# Patient Record
Sex: Female | Born: 1990 | Race: White | Hispanic: No | Marital: Single | Smoking: Never smoker
Health system: Southern US, Community
[De-identification: ages and names within clinical notes are randomized; demographics above are authoritative.]

---

## 2015-10-05 ENCOUNTER — Encounter (HOSPITAL_COMMUNITY): Payer: Self-pay

## 2015-10-05 ENCOUNTER — Emergency Department (HOSPITAL_COMMUNITY)
Admission: EM | Admit: 2015-10-05 | Discharge: 2015-10-05 | Disposition: A | Discharge status: Home / Self Care | Payer: Self-pay | Attending: Emergency Medicine | Admitting: Emergency Medicine

## 2015-10-05 ENCOUNTER — Emergency Department (HOSPITAL_COMMUNITY): Payer: Self-pay

## 2015-10-05 DIAGNOSIS — X509XXA Other and unspecified overexertion or strenuous movements or postures, initial encounter: Secondary | ICD-10-CM | POA: Insufficient documentation

## 2015-10-05 DIAGNOSIS — S29019A Strain of muscle and tendon of unspecified wall of thorax, initial encounter: Secondary | ICD-10-CM

## 2015-10-05 DIAGNOSIS — Y929 Unspecified place or not applicable: Secondary | ICD-10-CM | POA: Insufficient documentation

## 2015-10-05 DIAGNOSIS — S29012A Strain of muscle and tendon of back wall of thorax, initial encounter: Secondary | ICD-10-CM | POA: Insufficient documentation

## 2015-10-05 DIAGNOSIS — Y9341 Activity, dancing: Secondary | ICD-10-CM | POA: Insufficient documentation

## 2015-10-05 DIAGNOSIS — Y999 Unspecified external cause status: Secondary | ICD-10-CM | POA: Insufficient documentation

## 2015-10-05 LAB — CBC
HEMATOCRIT: 39.4 % (ref 36.0–46.0)
HEMOGLOBIN: 13.6 g/dL (ref 12.0–15.0)
MCH: 30.8 pg (ref 26.0–34.0)
MCHC: 34.5 g/dL (ref 30.0–36.0)
MCV: 89.3 fL (ref 78.0–100.0)
Platelets: 318 10*3/uL (ref 150–400)
RBC: 4.41 MIL/uL (ref 3.87–5.11)
RDW: 12.8 % (ref 11.5–15.5)
WBC: 10.7 10*3/uL — ABNORMAL HIGH (ref 4.0–10.5)

## 2015-10-05 LAB — BASIC METABOLIC PANEL
Anion gap: 10 (ref 5–15)
BUN: 6 mg/dL (ref 6–20)
CALCIUM: 9.6 mg/dL (ref 8.9–10.3)
CHLORIDE: 103 mmol/L (ref 101–111)
CO2: 26 mmol/L (ref 22–32)
CREATININE: 0.79 mg/dL (ref 0.44–1.00)
GFR calc Af Amer: >60 mL/min (ref >60)
GFR calc non Af Amer: >60 mL/min (ref >60)
GLUCOSE: 90 mg/dL (ref 65–99)
Potassium: 3.8 mmol/L (ref 3.5–5.1)
Sodium: 139 mmol/L (ref 135–145)

## 2015-10-05 LAB — URINALYSIS, ROUTINE W REFLEX MICROSCOPIC
BILIRUBIN URINE: NEGATIVE
GLUCOSE, UA: NEGATIVE mg/dL
HGB URINE DIPSTICK: NEGATIVE
Ketones, ur: NEGATIVE mg/dL
Leukocytes, UA: NEGATIVE
Nitrite: NEGATIVE
PROTEIN: NEGATIVE mg/dL
SPECIFIC GRAVITY, URINE: 1.005 (ref 1.005–1.030)
pH: 6.5 (ref 5.0–8.0)

## 2015-10-05 LAB — PREGNANCY, URINE: Preg Test, Ur: NEGATIVE

## 2015-10-05 MED ORDER — DIAZEPAM 5 MG PO TABS
5.0000 mg | ORAL_TABLET | Freq: Once | ORAL | Status: AC
  Administered 2015-10-05: 5 mg via ORAL
  Filled 2015-10-05: qty 1

## 2015-10-05 MED ORDER — TRAMADOL HCL 50 MG PO TABS: 50.0000 mg | ORAL_TABLET | Freq: Four times a day (QID) | ORAL | 0 refills | Status: AC | PRN

## 2015-10-05 MED ORDER — KETOROLAC TROMETHAMINE 15 MG/ML IJ SOLN
15.0000 mg | Freq: Once | INTRAMUSCULAR | Status: AC
  Administered 2015-10-05: 15 mg via INTRAVENOUS
  Filled 2015-10-05: qty 1

## 2015-10-05 MED ORDER — TRAMADOL HCL 50 MG PO TABS
50.0000 mg | ORAL_TABLET | Freq: Once | ORAL | Status: AC
  Administered 2015-10-05: 50 mg via ORAL
  Filled 2015-10-05: qty 1

## 2015-10-05 MED ORDER — DIAZEPAM 5 MG PO TABS: 5.0000 mg | ORAL_TABLET | Freq: Two times a day (BID) | ORAL | 0 refills | Status: AC

## 2015-10-05 NOTE — ED Notes (Signed)
No respiratory or acute distress noted alert and oriented x 3 call light in reach still having pain awaiting doctor to see pt.

## 2015-10-05 NOTE — ED Notes (Signed)
No respiratory or acute distress noted alert and oriented x 3 no reaction to medication noted call light in reach. 

## 2015-10-05 NOTE — ED Provider Notes (Signed)
[facility]-EMERGENCY DEPT Provider Note   CSN: [id number] Arrival date & time: 10/05/15  0351     History   Chief Complaint Chief Complaint  Patient presents with  . Flank Pain    HPI Victoria Cobb is a 25 y.o. female.  The history is provided by the patient.  Back Pain   This is a new problem. Episode onset: 10 days ago. The problem occurs constantly. The problem has been rapidly worsening. Associated with: Patient dances for a living and felt like she pulled something 10 days ago and then tonight while she was dancing suddenly had significant worsening of her pain. The pain is present in the thoracic spine. The quality of the pain is described as stabbing and shooting. The pain does not radiate. The pain is at a severity of 9/10. The pain is severe. The symptoms are aggravated by bending, twisting and certain positions (Deep breathing). The pain is the same all the time. Stiffness is present in the morning. Associated symptoms include chest pain. Pertinent negatives include no fever, no bowel incontinence, no dysuria, no pelvic pain and no weakness. She has tried NSAIDs and bed rest for the symptoms. The treatment provided mild relief. Risk factors: History of blood clots in her family and she does not take estrogens.    History reviewed. No pertinent past medical history.  There are no active problems to display for this patient.   History reviewed. No pertinent surgical history.  OB History    No data available       Home Medications    Prior to Admission medications   Medication Sig Start Date End Date Taking? Authorizing Provider  Multiple Vitamin (MULTIVITAMIN WITH MINERALS) TABS tablet Take 2 tablets by mouth daily.   Yes Historical Provider, MD    Family History History reviewed. No pertinent family history.  Social History Social History  Substance Use Topics  . Smoking status: Never Smoker  . Smokeless tobacco: Never Used  . Alcohol use No     Allergies     Ceclor [cefaclor] and Cefzil [cefprozil]   Review of Systems Review of Systems  Constitutional: Negative for fever.  Cardiovascular: Positive for chest pain.  Gastrointestinal: Negative for bowel incontinence.  Genitourinary: Negative for dysuria and pelvic pain.  Musculoskeletal: Positive for back pain.  Neurological: Negative for weakness.  All other systems reviewed and are negative.    Physical Exam Updated Vital Signs BP 108/78 (BP Location: Left Arm)   Pulse 84   Temp 98.5 F (36.9 C) (Oral)   Resp 20   Ht 5\' 2"  (1.575 m)   Wt 120 lb (54.4 kg)   LMP 09/04/2015   SpO2 98%   BMI 21.95 kg/m   Physical Exam  Constitutional: She is oriented to person, place, and time. She appears well-developed and well-nourished. No distress.  HENT:  Head: Normocephalic and atraumatic.  Mouth/Throat: Oropharynx is clear and moist.  Eyes: Conjunctivae and EOM are normal. Pupils are equal, round, and reactive to light.  Neck: Normal range of motion. Neck supple.  Cardiovascular: Normal rate, regular rhythm and intact distal pulses.   No murmur heard. Pulmonary/Chest: Effort normal and breath sounds normal. No respiratory distress. She has no wheezes. She has no rales.  Abdominal: Soft. She exhibits no distension. There is no tenderness. There is no rebound and no guarding.  Musculoskeletal: Normal range of motion. She exhibits tenderness. She exhibits no edema.       Thoracic back: She exhibits tenderness, pain and  spasm. She exhibits normal pulse.       Back:  Neurological: She is alert and oriented to person, place, and time.  Skin: Skin is warm and dry. No rash noted. No erythema.  Psychiatric: She has a normal mood and affect. Her behavior is normal.  Nursing note and vitals reviewed.    ED Treatments / Results  Labs (all labs ordered are listed, but only abnormal results are displayed) Labs Reviewed  CBC - Abnormal; Notable for the following:       Result Value   WBC  10.7 (*)    All other components within normal limits  URINALYSIS, ROUTINE W REFLEX MICROSCOPIC (NOT AT [facility])  PREGNANCY, URINE  BASIC METABOLIC PANEL    EKG  EKG Interpretation None       Radiology Dg Ribs Unilateral W/chest Right  Result Date: 10/05/2015 CLINICAL DATA:  Pulled RIGHT back muscle 1 week ago, fell pop tonight while dancing. Chest wall pain. EXAM: RIGHT RIBS AND CHEST - 3+ VIEW COMPARISON:  None. FINDINGS: No fracture or other bone lesions are seen involving the ribs. There is no evidence of pneumothorax or pleural effusion. Both lungs are clear. Heart size and mediastinal contours are within normal limits. IMPRESSION: Negative. Electronically Signed   By: Awilda Metroourtnay  Bloomer M.D.   On: 10/05/2015 06:30    Procedures Procedures (including critical care time)  Medications Ordered in ED Medications  ketorolac (TORADOL) 15 MG/ML injection 15 mg (not administered)     Initial Impression / Assessment and Plan / ED Course  I have reviewed the triage vital signs and the nursing notes.  Pertinent labs & imaging results that were available during my care of the patient were reviewed by me and considered in my medical decision making (see chart for details).  Clinical Course    Patient is a 25 year old healthy female presenting today with right-sided thoracic pain. This started approximately 10 days ago while she was pulled dancing. She has been using ibuprofen some relief and rested for the last 3 days however tonight she was on stage and didn't move and had sudden severe worsening of her pain that now is a 9 out of 10 and is uncomfortable with breathing and any movement. She denies shortness of breath, weakness or neurologic symptoms. As no symptoms concerning for a PE and is PERC negative. We will get a chest x-ray to rule out rib fracture or pneumothorax. She denies any other known trauma. She had labs and a urine pregnancy test done while in triage which were  unremarkable.  Patient given Toradol.  6:43 AM Minimal improved with toradol.  Imaging neg.  Will d/c home with treatment for muscle spasm  Final Clinical Impressions(s) / ED Diagnoses   Final diagnoses:  Acute thoracic myofascial strain, initial encounter    New Prescriptions New Prescriptions   DIAZEPAM (VALIUM) 5 MG TABLET    Take 1 tablet (5 mg total) by mouth 2 (two) times daily.   TRAMADOL (ULTRAM) 50 MG TABLET    Take 1 tablet (50 mg total) by mouth every 6 (six) hours as needed.     Gwyneth SproutWhitney Charmel Pronovost, MD 10/05/15 [id number]

## 2015-10-05 NOTE — ED Triage Notes (Signed)
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 She states that its hard to take a deep breath and move certain ways

## 2016-02-19 ENCOUNTER — Ambulatory Visit: Admit: 2016-02-19 | Payer: Self-pay | Admitting: Obstetrics & Gynecology

## 2016-02-19 SURGERY — HYMENECTOMY
Anesthesia: Choice

## 2016-06-20 ENCOUNTER — Encounter (HOSPITAL_COMMUNITY): Payer: Self-pay

## 2016-06-20 ENCOUNTER — Emergency Department (HOSPITAL_COMMUNITY)
Admission: EM | Admit: 2016-06-20 | Discharge: 2016-06-20 | Disposition: A | Discharge status: Home / Self Care | Payer: Self-pay | Attending: Emergency Medicine | Admitting: Emergency Medicine

## 2016-06-20 DIAGNOSIS — F10120 Alcohol abuse with intoxication, uncomplicated: Secondary | ICD-10-CM | POA: Insufficient documentation

## 2016-06-20 DIAGNOSIS — F10920 Alcohol use, unspecified with intoxication, uncomplicated: Secondary | ICD-10-CM

## 2016-06-20 NOTE — ED Notes (Signed)
Patient walked to bathroom without assistance and changed out of wet clothes. Patient walked back to bed without assistance.

## 2016-06-20 NOTE — ED Triage Notes (Signed)
Patient is highly intoxicated and uber driver brought patient to ED. Patient threw up in the uber.

## 2016-06-20 NOTE — ED Notes (Signed)
Pt was brought in by her [employer] driver, who statedshe was picked up at [facility] and was taking her home and she became unresponsive and on the way here the patient vomited in the [employer] car.

## 2016-06-20 NOTE — ED Provider Notes (Signed)
[facility] DEPT Provider Note   CSN: [id number] Arrival date & time: 06/20/16  0218    History   Chief Complaint Chief Complaint  Patient presents with  . intoxicated    LEVEL 5 CAVEAT SECONDARY TO INTOXICATION  HPI Victoria Cobb is a 26 y.o. female.  26 y/o female presents to the ED for acute ETOH intoxication. Per notes, patient transported by [facility] driver after she was picked up at the [facility] and became "unresponsive" in the back of the vehicle. Patient subsequently vomited in the back of the car. She is awake and alert to verbal and physical stimuli. Speech slurred. Denies nausea.      History reviewed. No pertinent past medical history.  There are no active problems to display for this patient.   History reviewed. No pertinent surgical history.  OB History    No data available       Home Medications    Prior to Admission medications   Medication Sig Start Date End Date Taking? Authorizing Provider  diazepam (VALIUM) 5 MG tablet Take 1 tablet (5 mg total) by mouth 2 (two) times daily. 10/05/15   Gwyneth Sprout, MD  Multiple Vitamin (MULTIVITAMIN WITH MINERALS) TABS tablet Take 2 tablets by mouth daily.    [provider]  traMADol (ULTRAM) 50 MG tablet Take 1 tablet (50 mg total) by mouth every 6 (six) hours as needed. 10/05/15   Gwyneth Sprout, MD    Family History History reviewed. No pertinent family history.  Social History Social History  Substance Use Topics  . Smoking status: Never Smoker  . Smokeless tobacco: Never Used  . Alcohol use Yes     Allergies   Ceclor [cefaclor] and Cefzil [cefprozil]   Review of Systems Review of Systems  Unable to perform ROS: Other  Acute intoxication; AMS   Physical Exam Updated Vital Signs BP 105/83   Pulse 87   Temp 98.1 F (36.7 C) (Oral)   Resp 15   Ht 5\' 4"  (1.626 m)   Wt 59 kg (130 lb)   SpO2 94%   BMI 22.31 kg/m   Physical Exam  Constitutional: She appears  well-developed and well-nourished. No distress.  Disheveled. Vomit in hair.  HENT:  Head: Normocephalic and atraumatic.  Eyes: Conjunctivae and EOM are normal. No scleral icterus.  Neck: Normal range of motion.  Cardiovascular: Normal rate, regular rhythm and intact distal pulses.   Pulmonary/Chest: Effort normal. No respiratory distress. She has no wheezes.  Respirations even and unlabored  Musculoskeletal: Normal range of motion.  Neurological: She is alert. She exhibits normal muscle tone. Coordination normal.  GCS 15. Agitated and cursing. Speech is goal oriented. Slurred. Patient moving all extremities.  Skin: Skin is warm and dry. No rash noted. She is not diaphoretic. No erythema. No pallor.  Psychiatric: She has a normal mood and affect. Her behavior is normal.  Nursing note and vitals reviewed.    ED Treatments / Results  Labs (all labs ordered are listed, but only abnormal results are displayed) Labs Reviewed - No data to display  EKG  EKG Interpretation None       Radiology No results found.  Procedures Procedures (including critical care time)  Medications Ordered in ED Medications - No data to display   Initial Impression / Assessment and Plan / ED Course  I have reviewed the triage vital signs and the nursing notes.  Pertinent labs & imaging results that were available during my care of the patient were  reviewed by me and considered in my medical decision making (see chart for details).     26 year old female presents for acute alcohol intoxication. She has been allowed to sober in the emergency department. She is not ambulatory without assistance. Speech is clear. Patient deemed clinically sober. Plan to discharge in the care of adult or friend; RN attempting to contact friend or family for pick-up.   Vitals:   06/20/16 0235 06/20/16 0422 06/20/16 0529 06/20/16 0604  BP:  98/65 111/74 105/83  Pulse:  79 79 87  Resp:  18 16 15   Temp:      TempSrc:       SpO2:  100% 97% 94%  Weight: 59 kg (130 lb)     Height: 5\' 4"  (1.626 m)       Final Clinical Impressions(s) / ED Diagnoses   Final diagnoses:  Alcoholic intoxication without complication [facility](HCC)    New Prescriptions New Prescriptions   No medications on file     Antony MaduraHumes, Vivianna Piccini, PA-C 06/20/16 0645    Molpus, Jonny RuizJohn, MD 06/20/16 [phone]

## 2017-11-03 ENCOUNTER — Other Ambulatory Visit: Payer: Self-pay | Admitting: Advanced Practice Midwife

## 2017-11-03 DIAGNOSIS — N6320 Unspecified lump in the left breast, unspecified quadrant: Secondary | ICD-10-CM

## 2017-11-21 ENCOUNTER — Other Ambulatory Visit (HOSPITAL_COMMUNITY): Payer: Self-pay | Admitting: *Deleted

## 2017-11-21 DIAGNOSIS — N6320 Unspecified lump in the left breast, unspecified quadrant: Secondary | ICD-10-CM

## 2017-12-09 ENCOUNTER — Ambulatory Visit (HOSPITAL_COMMUNITY)
Admission: RE | Admit: 2017-12-09 | Discharge: 2017-12-09 | Disposition: A | Discharge status: Home / Self Care | Payer: Self-pay | Source: Ambulatory Visit | Attending: Obstetrics and Gynecology | Admitting: Obstetrics and Gynecology

## 2017-12-09 ENCOUNTER — Other Ambulatory Visit (HOSPITAL_COMMUNITY): Payer: Self-pay | Admitting: *Deleted

## 2017-12-09 ENCOUNTER — Other Ambulatory Visit (HOSPITAL_COMMUNITY): Payer: Self-pay | Admitting: Obstetrics and Gynecology

## 2017-12-09 ENCOUNTER — Ambulatory Visit
Admission: RE | Admit: 2017-12-09 | Discharge: 2017-12-09 | Disposition: A | Discharge status: Home / Self Care | Payer: Self-pay | Source: Ambulatory Visit | Attending: Obstetrics and Gynecology | Admitting: Obstetrics and Gynecology

## 2017-12-09 ENCOUNTER — Ambulatory Visit
Admission: RE | Admit: 2017-12-09 | Discharge: 2017-12-09 | Disposition: A | Discharge status: Home / Self Care | Payer: PRIVATE HEALTH INSURANCE | Source: Ambulatory Visit | Attending: Obstetrics and Gynecology | Admitting: Obstetrics and Gynecology

## 2017-12-09 ENCOUNTER — Encounter (HOSPITAL_COMMUNITY): Payer: Self-pay

## 2017-12-09 VITALS — BP 102/64 | Wt 117.0 lb

## 2017-12-09 DIAGNOSIS — N6310 Unspecified lump in the right breast, unspecified quadrant: Secondary | ICD-10-CM

## 2017-12-09 DIAGNOSIS — N6320 Unspecified lump in the left breast, unspecified quadrant: Secondary | ICD-10-CM

## 2017-12-09 DIAGNOSIS — N6311 Unspecified lump in the right breast, upper outer quadrant: Secondary | ICD-10-CM

## 2017-12-09 DIAGNOSIS — N6325 Unspecified lump in the left breast, overlapping quadrants: Secondary | ICD-10-CM

## 2017-12-09 DIAGNOSIS — Z01419 Encounter for gynecological examination (general) (routine) without abnormal findings: Secondary | ICD-10-CM

## 2017-12-09 NOTE — Progress Notes (Signed)
No complaints today.   Pap Smear: Pap smear completed today. [place] in [place] and abnormal per patient. Per patient her last Pap smear is the only abnormal Pap smear she has had. Patient stated she had a colposcopy to follow-up. No Pap smear results are in Epic.  Physical exam: Breasts Breasts symmetrical. No skin abnormalities bilateral breasts. No nipple retraction bilateral breasts. No nipple discharge bilateral breasts. No lymphadenopathy. Palpated a pea sized mobile lump within the left breast at 12 o'clock 9 cm from the nipple. Palpated a mobile lump within the right breast at 10 o'clock 11 cm from the nipple. No complaints of pain or tenderness on exam. [facility] breast [facility]. Appointment scheduled for Tuesday, December 09, 2017 at 1420.        Pelvic/Bimanual   Ext Genitalia No lesions, no swelling and no discharge observed on external genitalia.         Vagina Vagina pink and normal texture. No lesions or discharge observed in vagina.          Cervix Cervix is present. Cervix pink with a reddened area around the cervical os and of normal texture. No discharge observed.     Uterus Uterus is present and palpable. Uterus in normal position and normal size.        Adnexae Bilateral ovaries present and palpable. No tenderness on palpation.         Rectovaginal No rectal exam completed today since patient had no rectal complaints. No skin abnormalities observed on exam.    Smoking History: Patient has never smoked.  Patient Navigation: Patient education provided. Access to services provided for patient through [facility] program.   Breast and Cervical Cancer Risk Assessment: Patient has no family history of breast cancer, known genetic mutations, or radiation treatment to the chest before age 27. Per patient has a history of cervical dysplasia. Patient has no history of being immunocompromised or  DES exposure in-utero. Breast Cancer risk assessment completed. No breast cancer risk calculated due to patient is less than 27 years old.

## 2017-12-09 NOTE — Patient Instructions (Signed)
Explained breast self awareness with [place]. Let patient know that if today's Pap smear is normal that her next Pap smear will be due in one year due to her last Pap smear being abnormal. [facility] breast ultrasound. Appointment scheduled for Tuesday, December 09, 2017 at 1420. Patient aware of appointment and will be there. Let patient know will follow up with her within the next week with results of her Pap smear by phone. Victoria Cobb verbalized understanding.  Victoria Cobb, Victoria Maserhristine Poll, RN 1:27 PM

## 2017-12-10 ENCOUNTER — Encounter (HOSPITAL_COMMUNITY): Payer: Self-pay | Admitting: *Deleted

## 2017-12-12 LAB — CYTOLOGY - PAP
DIAGNOSIS: NEGATIVE
HPV (WINDOPATH): NOT DETECTED

## 2017-12-24 ENCOUNTER — Encounter (HOSPITAL_COMMUNITY): Payer: Self-pay | Admitting: *Deleted

## 2017-12-24 NOTE — Progress Notes (Signed)
Letter mailed to patient with negative pap smear results. HPV was negative. Next pap smear due in one year.  

## 2018-06-22 ENCOUNTER — Other Ambulatory Visit (HOSPITAL_COMMUNITY): Payer: Self-pay | Admitting: Obstetrics and Gynecology

## 2018-06-22 ENCOUNTER — Other Ambulatory Visit: Payer: Self-pay

## 2018-06-22 ENCOUNTER — Ambulatory Visit
Admission: RE | Admit: 2018-06-22 | Discharge: 2018-06-22 | Disposition: A | Discharge status: Home / Self Care | Payer: No Typology Code available for payment source | Source: Ambulatory Visit | Attending: Obstetrics and Gynecology | Admitting: Obstetrics and Gynecology

## 2018-06-22 DIAGNOSIS — N6310 Unspecified lump in the right breast, unspecified quadrant: Secondary | ICD-10-CM

## 2018-06-22 DIAGNOSIS — N6320 Unspecified lump in the left breast, unspecified quadrant: Secondary | ICD-10-CM

## 2018-12-14 ENCOUNTER — Emergency Department (HOSPITAL_COMMUNITY)
Admission: EM | Admit: 2018-12-14 | Discharge: 2018-12-14 | Disposition: A | Discharge status: Home / Self Care | Payer: BC Managed Care – PPO | Attending: Emergency Medicine | Admitting: Emergency Medicine

## 2018-12-14 ENCOUNTER — Other Ambulatory Visit: Payer: Self-pay

## 2018-12-14 ENCOUNTER — Encounter (HOSPITAL_COMMUNITY): Payer: Self-pay

## 2018-12-14 DIAGNOSIS — Z79899 Other long term (current) drug therapy: Secondary | ICD-10-CM | POA: Insufficient documentation

## 2018-12-14 DIAGNOSIS — Z20828 Contact with and (suspected) exposure to other viral communicable diseases: Secondary | ICD-10-CM | POA: Insufficient documentation

## 2018-12-14 DIAGNOSIS — J069 Acute upper respiratory infection, unspecified: Secondary | ICD-10-CM

## 2018-12-14 DIAGNOSIS — R05 Cough: Secondary | ICD-10-CM | POA: Diagnosis present

## 2018-12-14 LAB — INFLUENZA PANEL BY PCR (TYPE A & B)
Influenza A By PCR: NEGATIVE
Influenza B By PCR: NEGATIVE

## 2018-12-14 NOTE — ED Provider Notes (Signed)
[facility] DEPT Provider Note   CSN: [id number] Arrival date & time: 12/14/18  1820     History   Chief Complaint Chief Complaint  Patient presents with  . Cough  . Nasal Congestion  . Headache    HPI Victoria Cobb is a 28 y.o. female.     The history is provided by the patient.  Cough Cough characteristics:  Non-productive Sputum characteristics:  Nondescript Severity:  Mild Onset quality:  Gradual Duration:  3 days Timing:  Constant Progression:  Unchanged Chronicity:  New Context: upper respiratory infection   Relieved by: OTC meds, herbals. Worsened by:  Nothing Associated symptoms: fever, headaches, sinus congestion and sore throat   Associated symptoms: no chest pain, no chills, no ear pain, no rash and no shortness of breath   Headache Associated symptoms: cough, fever and sore throat   Associated symptoms: no abdominal pain, no back pain, no ear pain, no eye pain, no seizures and no vomiting     History reviewed. No pertinent past medical history.  There are no active problems to display for this patient.   History reviewed. No pertinent surgical history.   OB History    Gravida  0   Para  0   Term  0   Preterm  0   AB  0   Living  0     SAB  0   TAB  0   Ectopic  0   Multiple  0   Live Births  0            Home Medications    Prior to Admission medications   Medication Sig Start Date End Date Taking? Authorizing Provider  diazepam (VALIUM) 5 MG tablet Take 1 tablet (5 mg total) by mouth 2 (two) times daily. Patient not taking: Reported on 12/09/2017 10/05/15   Blanchie Dessert, MD  Multiple Vitamin (MULTIVITAMIN WITH MINERALS) TABS tablet Take 2 tablets by mouth daily.    [provider]  traMADol (ULTRAM) 50 MG tablet Take 1 tablet (50 mg total) by mouth every 6 (six) hours as needed. Patient not taking: Reported on 12/09/2017 10/05/15   Blanchie Dessert, MD    Family History  History reviewed. No pertinent family history.  Social History Social History   Tobacco Use  . Smoking status: Never Smoker  . Smokeless tobacco: Never Used  Substance Use Topics  . Alcohol use: Not Currently  . Drug use: No     Allergies   Ceclor [cefaclor], Cefzil [cefprozil], and Hydrocodone   Review of Systems Review of Systems  Constitutional: Positive for fever. Negative for chills.  HENT: Positive for sore throat. Negative for ear pain.   Eyes: Negative for pain and visual disturbance.  Respiratory: Positive for cough. Negative for shortness of breath.   Cardiovascular: Negative for chest pain and palpitations.  Gastrointestinal: Negative for abdominal pain and vomiting.  Genitourinary: Negative for dysuria and hematuria.  Musculoskeletal: Negative for arthralgias and back pain.  Skin: Negative for color change and rash.  Neurological: Positive for headaches. Negative for seizures and syncope.  All other systems reviewed and are negative.    Physical Exam Updated Vital Signs  Victoria Cobb  Physical Exam Vitals signs and nursing note reviewed.  Constitutional:      General: She is not in acute distress.    Appearance: She is well-developed.  HENT:     Head: Normocephalic and atraumatic.     Mouth/Throat:     Mouth:  Mucous membranes are moist.     Comments: No exudates in the back of her throat, overall clear oropharynx Eyes:     Extraocular Movements: Extraocular movements intact.     Conjunctiva/sclera: Conjunctivae normal.     Pupils: Pupils are equal, round, and reactive to light.  Neck:     Musculoskeletal: Normal range of motion and neck supple.  Cardiovascular:     Rate and Rhythm: Normal rate and regular rhythm.     Heart sounds: Normal heart sounds. No murmur.  Pulmonary:     Effort: Pulmonary effort is normal. No respiratory distress.     Breath sounds: Normal breath sounds.  Abdominal:     Palpations: Abdomen is soft.     Tenderness:  There is no abdominal tenderness.  Musculoskeletal: Normal range of motion.  Skin:    General: Skin is warm and dry.     Capillary Refill: Capillary refill takes less than 2 seconds.  Neurological:     Mental Status: She is alert and oriented to person, place, and time.  Psychiatric:        Mood and Affect: Mood normal.      ED Treatments / Results  Labs (all labs ordered are listed, but only abnormal results are displayed) Labs Reviewed  NOVEL CORONAVIRUS, NAA (HOSP ORDER, SEND-OUT TO REF LAB; TAT 18-24 HRS)    EKG None  Radiology No results found.  Procedures Procedures (including critical care time)  Medications Ordered in ED Medications - No data to display   Initial Impression / Assessment and Plan / ED Course  I have reviewed the triage vital signs and the nursing notes.  Pertinent labs & imaging results that were available during my care of the patient were reviewed by me and considered in my medical decision making (see chart for details).     Victoria Cobb is a 28 year old female who presents to the ED with URI symptoms.  Normal vitals.  No fever.  Symptoms for the last 3 days.  No sign of throat infection on exam.  No signs of respiratory distress.  Possible Covid exposure.  Overall well-appearing.  Given reassurance.  Given return precautions.  Tested for coronavirus.  Recommend self-isolation.  Understands return precautions.  This chart was dictated using voice recognition software.  Despite best efforts to proofread,  errors can occur which can change the documentation meaning.   Victoria Cobb was evaluated in Emergency Department on 12/14/2018 for the symptoms described in the history of present illness. She was evaluated in the context of the global COVID-19 pandemic, which necessitated consideration that the patient might be at risk for infection with the SARS-CoV-2 virus that causes COVID-19. Institutional protocols and algorithms that pertain to the  evaluation of patients at risk for COVID-19 are in a state of rapid change based on information released by regulatory bodies including the CDC and federal and state organizations. These policies and algorithms were followed during the patient's care in the ED.   Final Clinical Impressions(s) / ED Diagnoses   Final diagnoses:  Upper respiratory tract infection, unspecified type    ED Discharge Orders    None       Virgina Norfolk, DO 12/14/18 1853

## 2018-12-14 NOTE — ED Notes (Signed)
An After Visit Summary was printed and given to the patient. Discharge instructions given and no further questions at this time.  

## 2018-12-14 NOTE — ED Triage Notes (Signed)
Pt c/o cough, congestion, and headache x3 days. Pt states she has not been taking tylenol, temp 98.3.

## 2018-12-15 LAB — NOVEL CORONAVIRUS, NAA (HOSP ORDER, SEND-OUT TO REF LAB; TAT 18-24 HRS): SARS-CoV-2, NAA: NOT DETECTED

## 2018-12-23 ENCOUNTER — Ambulatory Visit
Admission: RE | Admit: 2018-12-23 | Discharge: 2018-12-23 | Disposition: A | Discharge status: Home / Self Care | Payer: BC Managed Care – PPO | Source: Ambulatory Visit | Attending: Obstetrics and Gynecology | Admitting: Obstetrics and Gynecology

## 2018-12-23 ENCOUNTER — Other Ambulatory Visit: Payer: Self-pay

## 2018-12-23 DIAGNOSIS — N6320 Unspecified lump in the left breast, unspecified quadrant: Secondary | ICD-10-CM

## 2018-12-23 DIAGNOSIS — N6310 Unspecified lump in the right breast, unspecified quadrant: Secondary | ICD-10-CM

## 2020-07-27 IMAGING — US ULTRASOUND LEFT BREAST LIMITED
1 series · 7 of 7 positions shown · non-contrast
Comparison: Previous exam(s).

CLINICAL DATA: 27-year-old female with a left breast palpable lump
for 2 months. She states this area has not changed in size over that
period. Additionally, her provider palpated a lump it is the right
breast on physical examination.

EXAM:
ULTRASOUND OF THE BILATERAL BREAST

[Series 1: ultrasound left breast limited · 0.06mm/px · 7 of 7 slices shown]
[im 1/7]
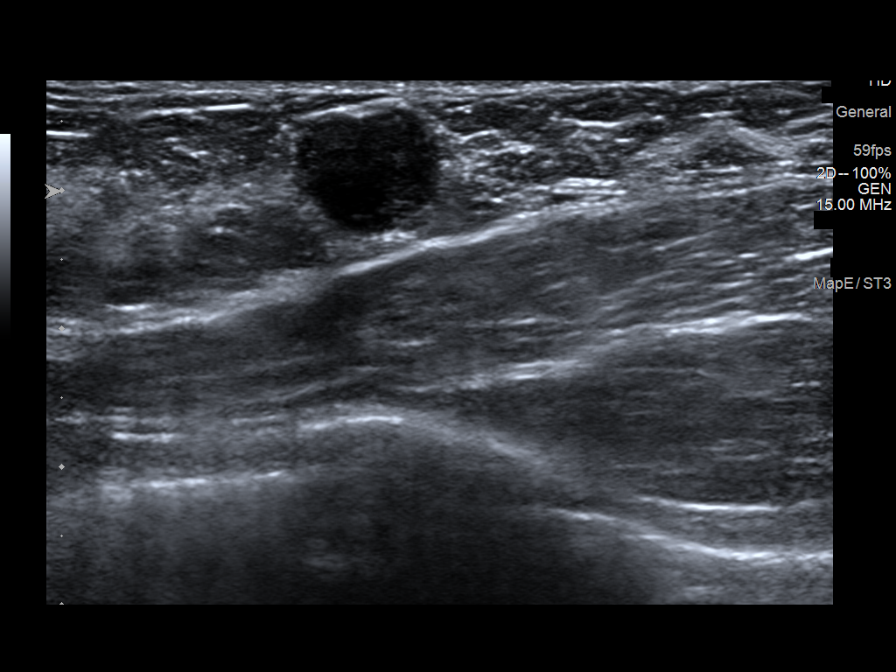
[im 2/7]
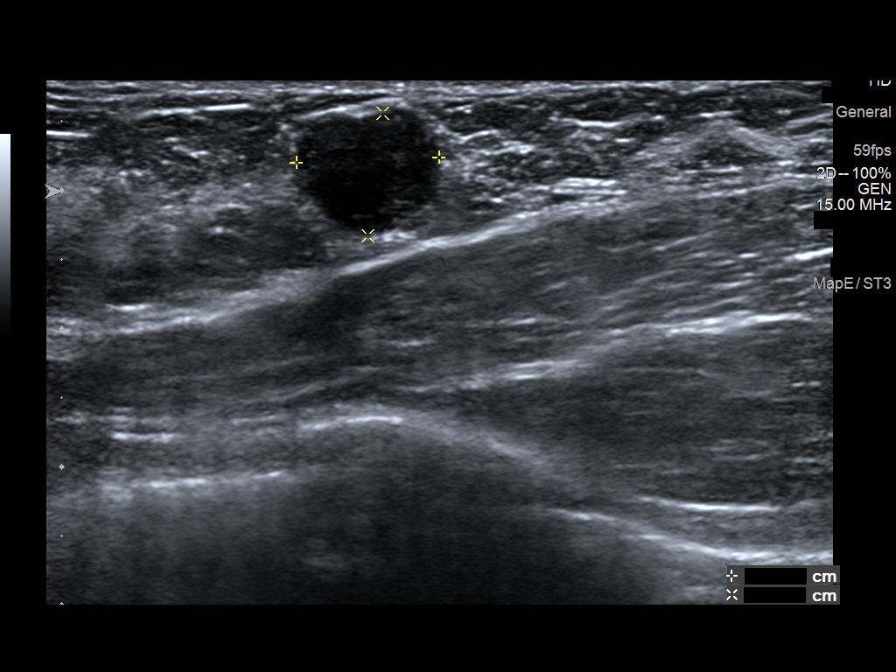
[im 3/7]
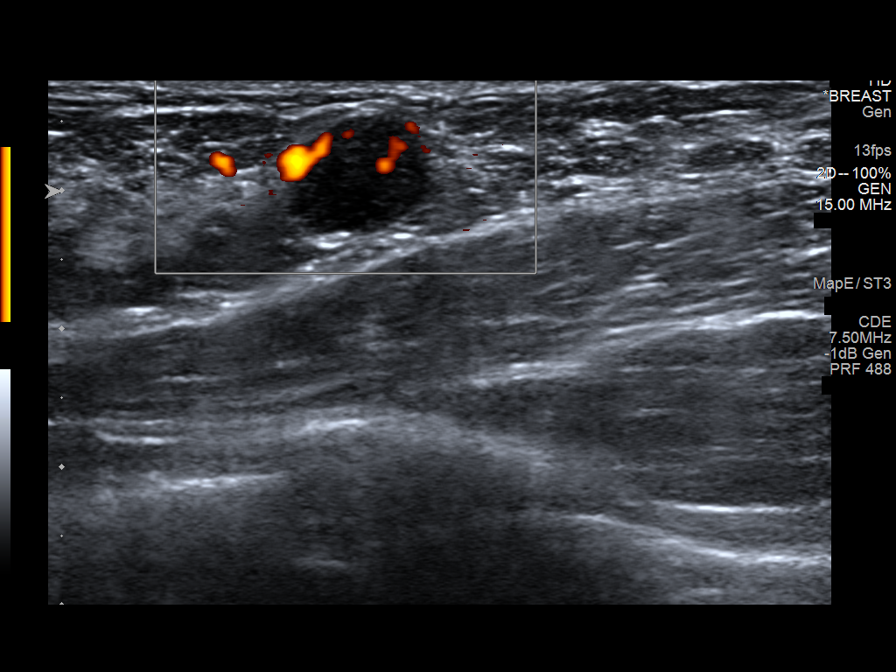
[im 4/7]
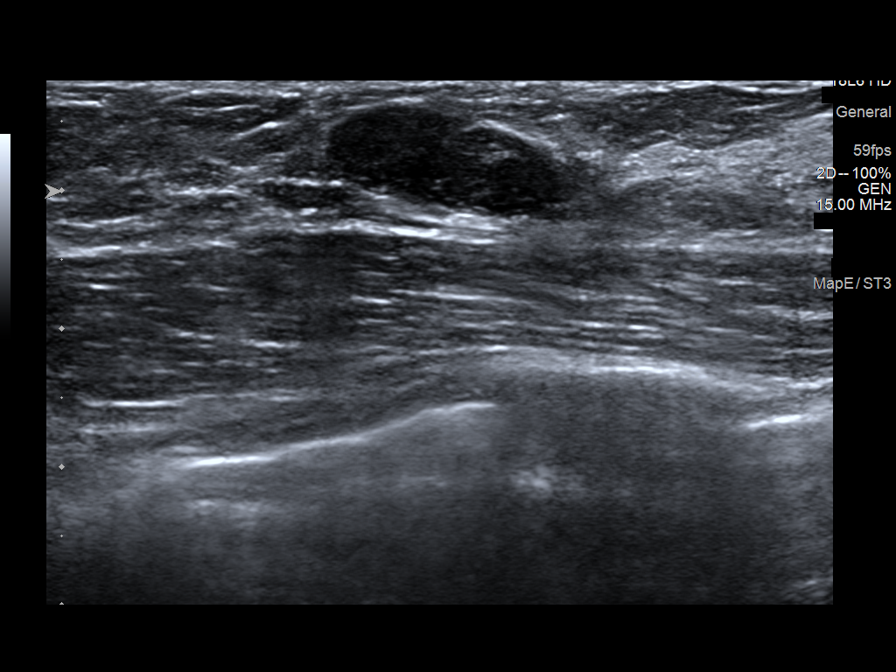
[im 5/7]
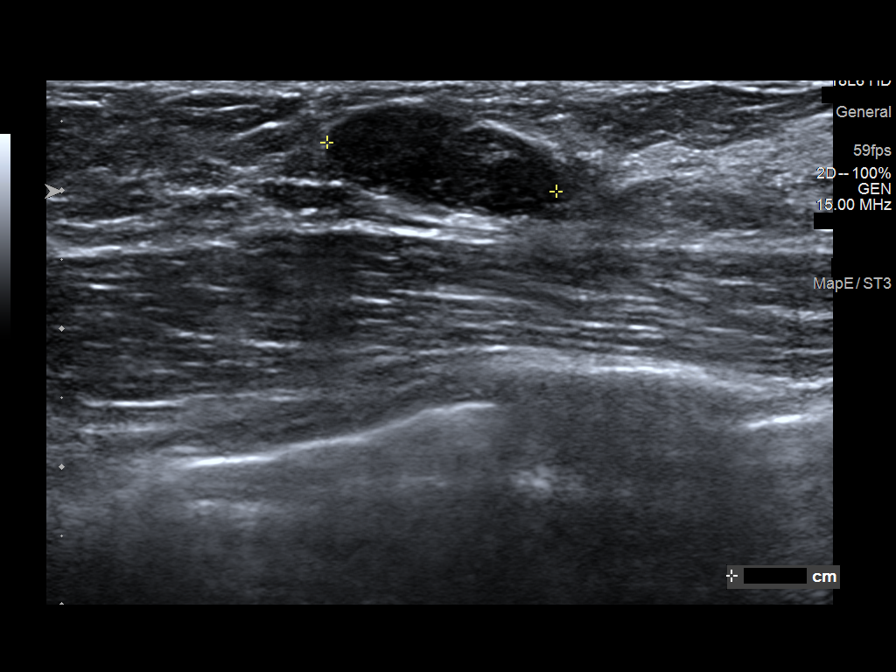
[im 6/7]
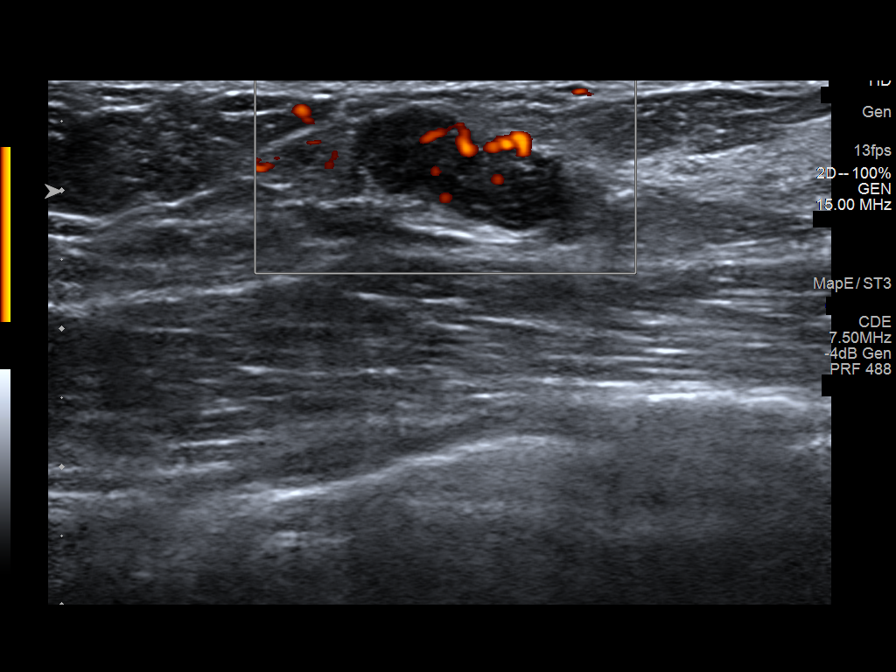
[im 7/7]
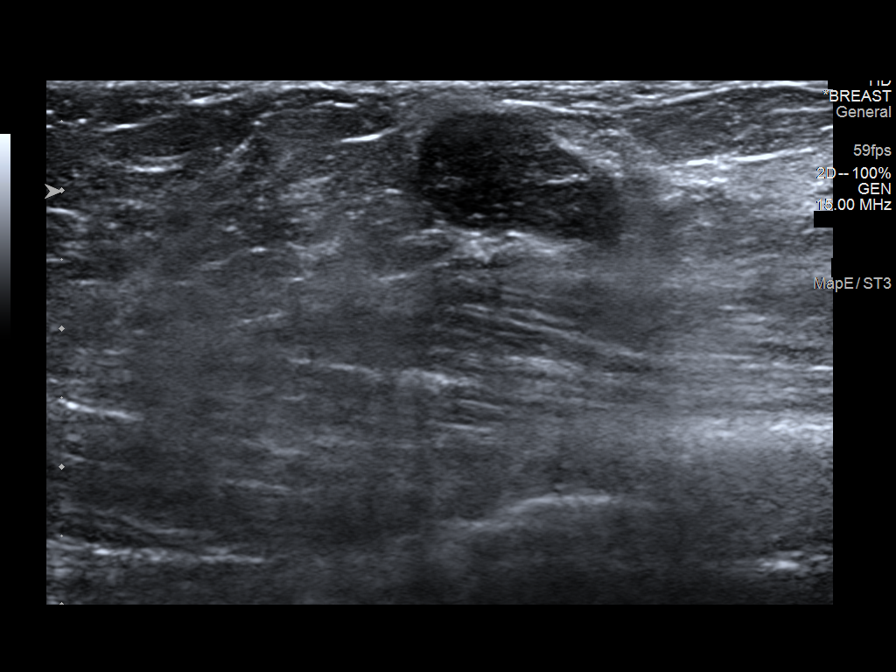

[7 of 7 positions shown; findings below may reference images not displayed]

FINDINGS: On physical exam, I palpate a firm, mobile 1-2 cm mass in the upper
outer quadrant of the left breast. I palpate heterogenous
fibroglandular tissue without focal lump in the upper outer right
breast.

Targeted ultrasound is performed, showing an oval, circumscribed
mass with associated vascularity at the 12 o'clock position 9 cm
from the nipple on the right. It measures 1.7 x 1.0 x 0.8 cm.

Evaluation of the right breast at mid straits an oval, circumscribed
hypoechoic mass at the 10 o'clock position 10 cm from the nipple. It
measures 9 x 8 x 5 mm with associated peripheral vascularity. This
is adjacent to the site of the provider palpated lump. No additional
focal findings are identified.
IMPRESSION: Probably benign bilateral breast masses likely representing
fibroadenomas. Recommendation is for short-term imaging follow-up.

RECOMMENDATION:
Bilateral ultrasound in 6 months. The patient was instructed to
perform monthly self examinations of the left breast and return
sooner if her palpable mass increases in size.

I have discussed the findings and recommendations with the patient.
Results were also provided in writing at the conclusion of the
visit. If applicable, a reminder letter will be sent to the patient
regarding the next appointment.

BI-RADS CATEGORY  3: Probably benign.

## 2020-08-19 ENCOUNTER — Emergency Department (HOSPITAL_COMMUNITY)
Admission: EM | Admit: 2020-08-19 | Discharge: 2020-08-19 | Disposition: A | Discharge status: Home / Self Care | Payer: Commercial Managed Care - PPO | Attending: Emergency Medicine | Admitting: Emergency Medicine

## 2020-08-19 ENCOUNTER — Other Ambulatory Visit: Payer: Self-pay

## 2020-08-19 ENCOUNTER — Encounter (HOSPITAL_COMMUNITY): Payer: Self-pay

## 2020-08-19 DIAGNOSIS — R42 Dizziness and giddiness: Secondary | ICD-10-CM | POA: Diagnosis present

## 2020-08-19 DIAGNOSIS — Z20822 Contact with and (suspected) exposure to covid-19: Secondary | ICD-10-CM | POA: Insufficient documentation

## 2020-08-19 LAB — COMPREHENSIVE METABOLIC PANEL
ALT: 9 U/L (ref 0–44)
AST: 18 U/L (ref 15–41)
Albumin: 4.9 g/dL (ref 3.5–5.0)
Alkaline Phosphatase: 38 U/L (ref 38–126)
Anion gap: 10 (ref 5–15)
BUN: 8 mg/dL (ref 6–20)
CO2: 24 mmol/L (ref 22–32)
Calcium: 9.5 mg/dL (ref 8.9–10.3)
Chloride: 108 mmol/L (ref 98–111)
Creatinine, Ser: 0.66 mg/dL (ref 0.44–1.00)
GFR, Estimated: >60 mL/min (ref >60)
Glucose, Bld: 92 mg/dL (ref 70–99)
Potassium: 3.4 mmol/L — ABNORMAL LOW (ref 3.5–5.1)
Sodium: 142 mmol/L (ref 135–145)
Total Bilirubin: 0.9 mg/dL (ref 0.3–1.2)
Total Protein: 8.1 g/dL (ref 6.5–8.1)

## 2020-08-19 LAB — CBC WITH DIFFERENTIAL/PLATELET
Abs Immature Granulocytes: 0.03 10*3/uL (ref 0.00–0.07)
Basophils Absolute: 0.1 10*3/uL (ref 0.0–0.1)
Basophils Relative: 1 %
Eosinophils Absolute: 0.1 10*3/uL (ref 0.0–0.5)
Eosinophils Relative: 2 %
HCT: 41 % (ref 36.0–46.0)
Hemoglobin: 13.8 g/dL (ref 12.0–15.0)
Immature Granulocytes: 0 %
Lymphocytes Relative: 34 %
Lymphs Abs: 2.8 10*3/uL (ref 0.7–4.0)
MCH: 30.9 pg (ref 26.0–34.0)
MCHC: 33.7 g/dL (ref 30.0–36.0)
MCV: 91.7 fL (ref 80.0–100.0)
Monocytes Absolute: 0.6 10*3/uL (ref 0.1–1.0)
Monocytes Relative: 7 %
Neutro Abs: 4.7 10*3/uL (ref 1.7–7.7)
Neutrophils Relative %: 56 %
Platelets: 280 10*3/uL (ref 150–400)
RBC: 4.47 MIL/uL (ref 3.87–5.11)
RDW: 12.5 % (ref 11.5–15.5)
WBC: 8.3 10*3/uL (ref 4.0–10.5)
nRBC: 0 % (ref 0.0–0.2)

## 2020-08-19 LAB — I-STAT BETA HCG BLOOD, ED (MC, WL, AP ONLY): I-stat hCG, quantitative: <5 m[IU]/mL (ref <5)

## 2020-08-19 NOTE — ED Provider Notes (Signed)
[facility]-EMERGENCY DEPT Provider Note   CSN: [id number] Arrival date & time: 08/19/20  1940     History Chief Complaint  Patient presents with   Dizziness    Victoria Cobb is a 30 y.o. female who presents today for evaluation of lightheadedness.  Yesterday she states she gave blood to be a potential match for a bone marrow donation and since then she has been feeling lightheaded.  This is exacerbated with position changes.  She states that it is improved today compared to yesterday.  She denies any syncopal events.  She denies any chest pain or shortness of breath.  No leg swelling, recent surgeries or immobilizations.  She states that she follows a very low sodium diet.  She denies any pain.    She does report occasional hot flashes and cold chills since donating.  No fevers.    HPI     History reviewed. No pertinent past medical history.  There are no problems to display for this patient.   History reviewed. No pertinent surgical history.   OB History     Gravida  0   Para  0   Term  0   Preterm  0   AB  0   Living  0      SAB  0   IAB  0   Ectopic  0   Multiple  0   Live Births  0           History reviewed. No pertinent family history.  Social History   Tobacco Use   Smoking status: Never   Smokeless tobacco: Never  Vaping Use   Vaping Use: Never used  Substance Use Topics   Alcohol use: Not Currently   Drug use: No    Home Medications Prior to Admission medications   Medication Sig Start Date End Date Taking? Authorizing Provider  diazepam (VALIUM) 5 MG tablet Take 1 tablet (5 mg total) by mouth 2 (two) times daily. Patient not taking: Reported on 12/09/2017 10/05/15   Gwyneth Sprout, MD  Multiple Vitamin (MULTIVITAMIN WITH MINERALS) TABS tablet Take 2 tablets by mouth daily.    [provider]  traMADol (ULTRAM) 50 MG tablet Take 1 tablet (50 mg total) by mouth every 6 (six) hours as  needed. Patient not taking: Reported on 12/09/2017 10/05/15   Gwyneth Sprout, MD    Allergies    Ceclor [cefaclor], Cefzil [cefprozil], and Hydrocodone  Review of Systems   Review of Systems  Constitutional:  Positive for chills. Negative for fatigue and fever.  HENT:  Negative for congestion.   Eyes:  Negative for visual disturbance.  Respiratory:  Negative for cough, chest tightness and shortness of breath.   Cardiovascular:  Negative for chest pain, palpitations and leg swelling.  Gastrointestinal:  Negative for abdominal pain, diarrhea and vomiting.  Musculoskeletal:  Negative for arthralgias.  Skin:  Negative for color change and rash.  Neurological:  Positive for light-headedness. Negative for dizziness, seizures, facial asymmetry, speech difficulty, weakness and headaches.  All other systems reviewed and are negative.  Physical Exam Updated Vital Signs BP 121/89 (BP Location: Right Arm)   Pulse 61   Temp 98.8 F (37.1 C) (Oral)   Resp 16   Ht 5\' 4"  (1.626 m)   Wt 54.4 kg   SpO2 100%   BMI 20.60 kg/m   Physical Exam Vitals and nursing note reviewed.  Constitutional:      General: She is not in acute distress.  Appearance: She is not ill-appearing or diaphoretic.     Comments: While I am in the room patient is able to stand and ambulate to the bathroom without feeling lightheaded  HENT:     Head: Normocephalic and atraumatic.  Eyes:     General: No scleral icterus.       Right eye: No discharge.        Left eye: No discharge.     Conjunctiva/sclera: Conjunctivae normal.  Cardiovascular:     Rate and Rhythm: Normal rate and regular rhythm.     Pulses: Normal pulses.  Pulmonary:     Effort: Pulmonary effort is normal. No respiratory distress.     Breath sounds: No stridor.  Abdominal:     General: There is no distension.  Musculoskeletal:        General: No deformity.     Cervical back: Normal range of motion.  Skin:    General: Skin is warm and dry.   Neurological:     Mental Status: She is alert.     Motor: No abnormal muscle tone.     Comments: Patient is awake and alert, answers questions appropriately without difficulty.  Psychiatric:        Mood and Affect: Mood normal.        Behavior: Behavior normal.    ED Results / Procedures / Treatments   Labs (all labs ordered are listed, but only abnormal results are displayed) Labs Reviewed  COMPREHENSIVE METABOLIC PANEL - Abnormal; Notable for the following components:      Result Value   Potassium 3.4 (*)    All other components within normal limits  SARS CORONAVIRUS 2 (TAT 6-24 HRS)  CBC WITH DIFFERENTIAL/PLATELET  I-STAT BETA HCG BLOOD, ED ([facility], [facility], [facility] ONLY)    EKG EKG Interpretation  Date/Time:  Saturday August 19 2020 21:31:41 EDT Ventricular Rate:  64 PR Interval:  118 QRS Duration: 86 QT Interval:  412 QTC Calculation: 425 R Axis:   81 Text Interpretation: Normal sinus rhythm with sinus arrhythmia Normal ECG No previous ECGs available Confirmed by Richardean Canal [id number] on 08/19/2020 10:06:12 PM  Radiology No results found.  Procedures Procedures   Medications Ordered in ED Medications - No data to display  Orthostatic VS for the past 24 hrs (Last 3 readings):  BP- Lying Pulse- Lying BP- Sitting Pulse- Sitting BP- Standing at 0 minutes Pulse- Standing at 0 minutes BP- Standing at 3 minutes Pulse- Standing at 3 minutes  08/19/20 2136 121/89 61 112/87 85 (!) 107/92 104 (!) 112/93 88     ED Course  I have reviewed the triage vital signs and the nursing notes.  Pertinent labs & imaging results that were available during my care of the patient were reviewed by me and considered in my medical decision making (see chart for details).    MDM Rules/Calculators/A&P                           Patient is a 30 year old woman who presents today for evaluation of approximately 24 hours of feeling lightheaded after she had blood drawn yesterday to evaluate if she may  be a match for a bone marrow donation.  She has not had any medications prior to this.  She has not had any syncopal events.  Here her labs are reassuring with CBC and CMP without anemia, leukocytosis, or any significant acute electrolyte derangements.  Here she is afebrile, not tachycardic or  tachypneic and 100% on room air.  EKG is without ischemia or arrhythmia.  She did have changes in her heart rate with position changes however her blood pressure did not significantly change and she was asymptomatic.  Regnancy test is negative  As patient did report having hot and cold flashes COVID testing is sent.  As she is not having any chest pain chest x-ray was not obtained. I recommended that she increase her salt intake as she follows a very low salt diet.  Additionally recommended increasing her p.o. fluids.  It appears that her symptoms have been getting better with conservative care already since yesterday.  I advised her to allow her body time to adjust with position changes and she states her understanding.  She was able to ambulate to the bathroom without any symptoms while in the department.  Return precautions were discussed with patient who states their understanding.  At the time of discharge patient denied any unaddressed complaints or concerns.  Patient is agreeable for discharge home.  Note: Portions of this report may have been transcribed using voice recognition software. Every effort was made to ensure accuracy; however, inadvertent computerized transcription errors may be present  Final Clinical Impression(s) / ED Diagnoses Final diagnoses:  Lightheadedness    Rx / DC Orders ED Discharge Orders     None        Cristina Gong, PA-C 08/20/20 0121    Charlynne Pander, MD 08/20/20 [phone]

## 2020-08-19 NOTE — Discharge Instructions (Signed)
Today you have had a COVID test ordered.  You can get these results in your MyChart.  If you are still having symptoms and this test is negative I would recommend isolating and repeat testing if you develop any new or changing symptoms.  Please make sure you are drinking plenty of water.  I would recommend drinking 64 ounces of water, or water mixed with Gatorade a day. Additionally please make sure you are eating salty foods. Please give your body time to adjust with any position changes. If you pass out, develop fevers, your symptoms worsen or you have any other concerns please seek additional medical care and evaluation.

## 2020-08-19 NOTE — ED Triage Notes (Signed)
Pt complains of dizziness x 1 day after giving blood to be a potential match for a bone marrow candidate. Pt states that she continues to feel light headed and dizzy even after eating and resting overnight.

## 2020-08-19 NOTE — ED Notes (Signed)
Pt ambulatory in ED lobby. 

## 2020-08-20 LAB — SARS CORONAVIRUS 2 (TAT 6-24 HRS): SARS Coronavirus 2: NEGATIVE

## 2020-08-21 ENCOUNTER — Ambulatory Visit: Payer: Self-pay | Admitting: *Deleted

## 2020-08-21 NOTE — Telephone Encounter (Signed)
Pt's mother had called, not with pt. Expresses concern as pt had "Lots of blood drawn for Match" and fingers are blue." Asked NT to call pt and advise. Called pt, VM full, let mother know taht as requested. No pt information given to mother.  Pt returned call, states finger tips are bluish, seen in ED 08/19/20. States was told in ED "Give it time." Pt states 95# and "maybe should not have had that much blood drawn." States still with some dizziness at times. Also reports tachy at times "But my mother puts me into panic mode. Requesting mother be removed from contacts. Is hydrating as advised in ED. Advised UC  for eval. Reluctant to go. Advised for worsening, or symptoms remain tomorrow, go to UC. States will follow disposition.    Answer Assessment - Initial Assessment Questions 1. REASON FOR CALL: "What is your main concern right now?"     See triage summary 2. ONSET: "When did the *No Answer* start?"     *No Answer* 3. SEVERITY: "How bad is the *No Answer*?"     *No Answer* 4. FEVER: "Do you have a fever?"     *No Answer* 5. OTHER SYMPTOMS: "Do you have any other new symptoms?"     *No Answer* 6. TREATMENTS AND RESPONSE: "What have you done so far to try to make this better? What medicines have you used?"     *No Answer* 7. PREGNANCY: "Is there any chance you are pregnant?" "When was your last menstrual period?"     *No Answer*  Protocols used: No Guideline Available-A-AH

## 2020-08-21 NOTE — Telephone Encounter (Signed)
Addressed in triage encounter

## 2021-02-07 IMAGING — US ULTRASOUND LEFT BREAST LIMITED
1 series · 5 of 5 positions shown · non-contrast
Comparison: Previous exam(s).

CLINICAL DATA: 27-year-old female presenting for first six-month
follow-up for probably benign bilateral breast masses.

EXAM:
ULTRASOUND OF THE BILATERAL BREAST

[Series 1: ultrasound left breast limited · 0.06mm/px · 5 of 5 slices shown]
[im 1/5]
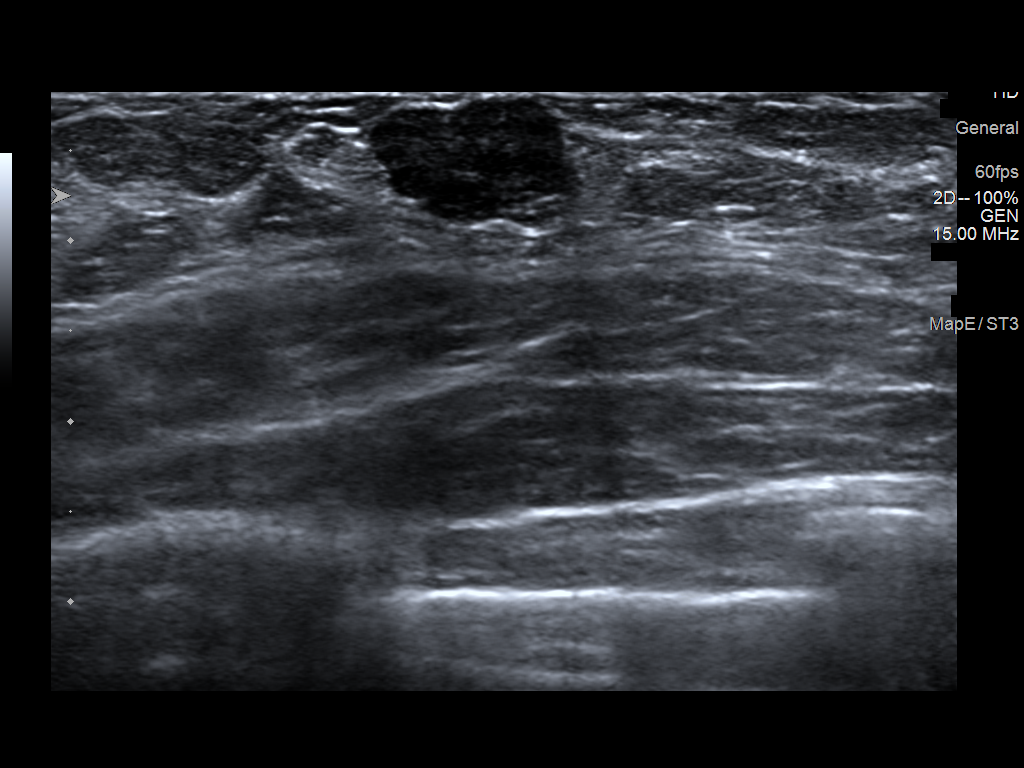
[im 2/5]
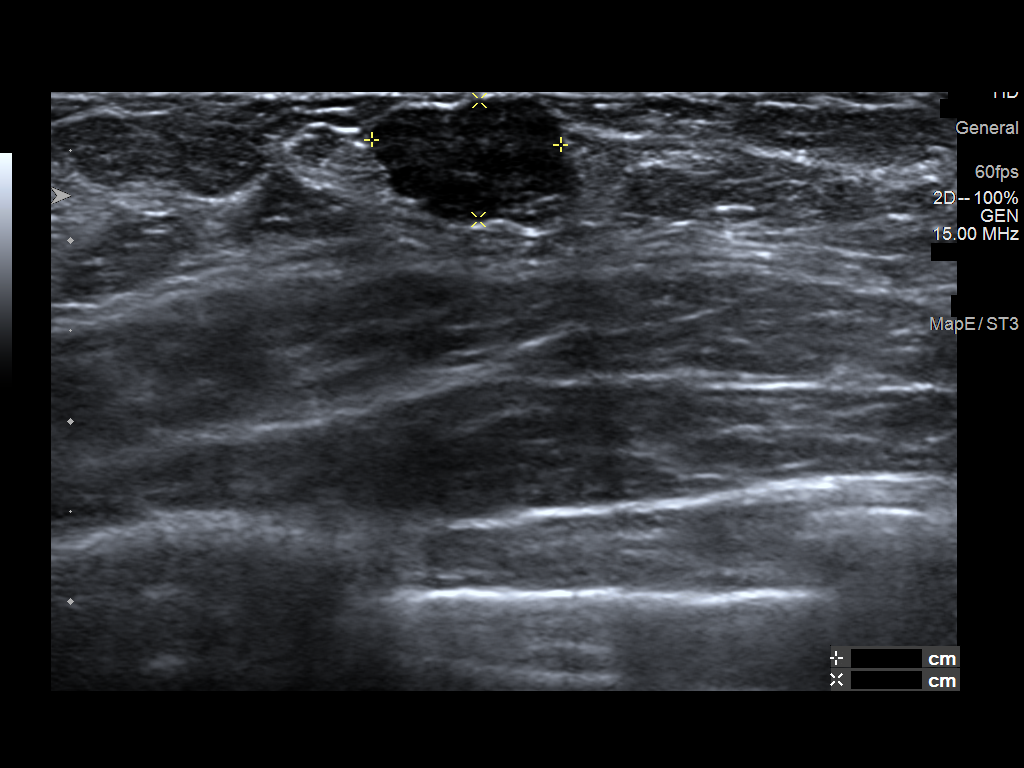
[im 3/5]
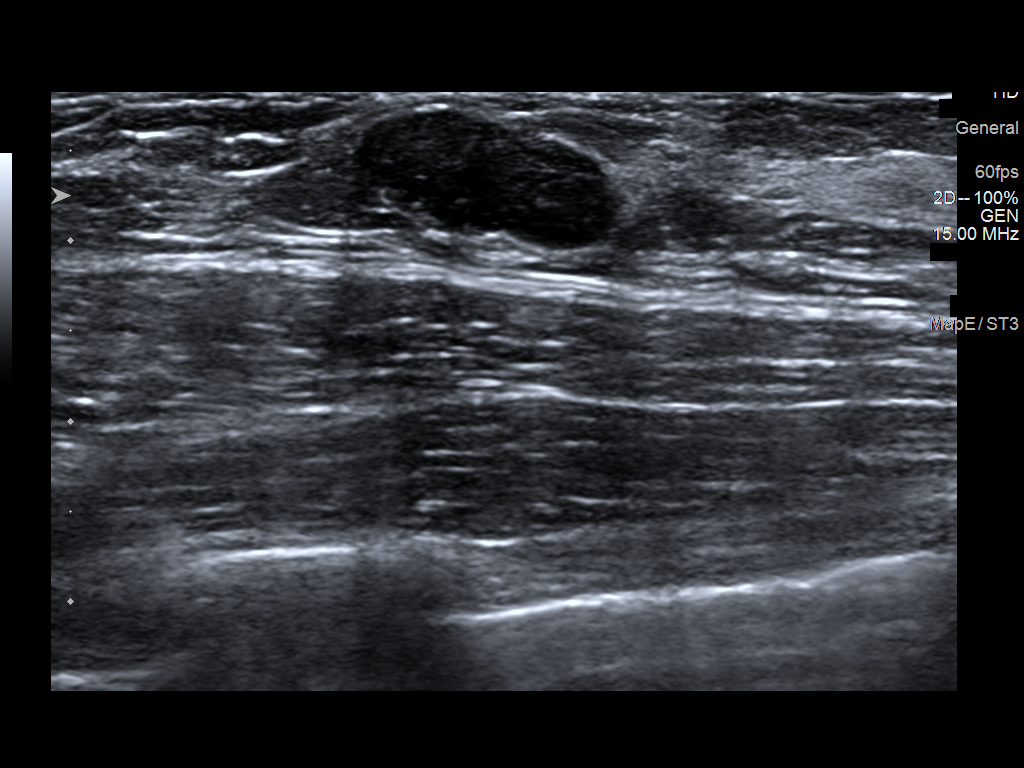
[im 4/5]
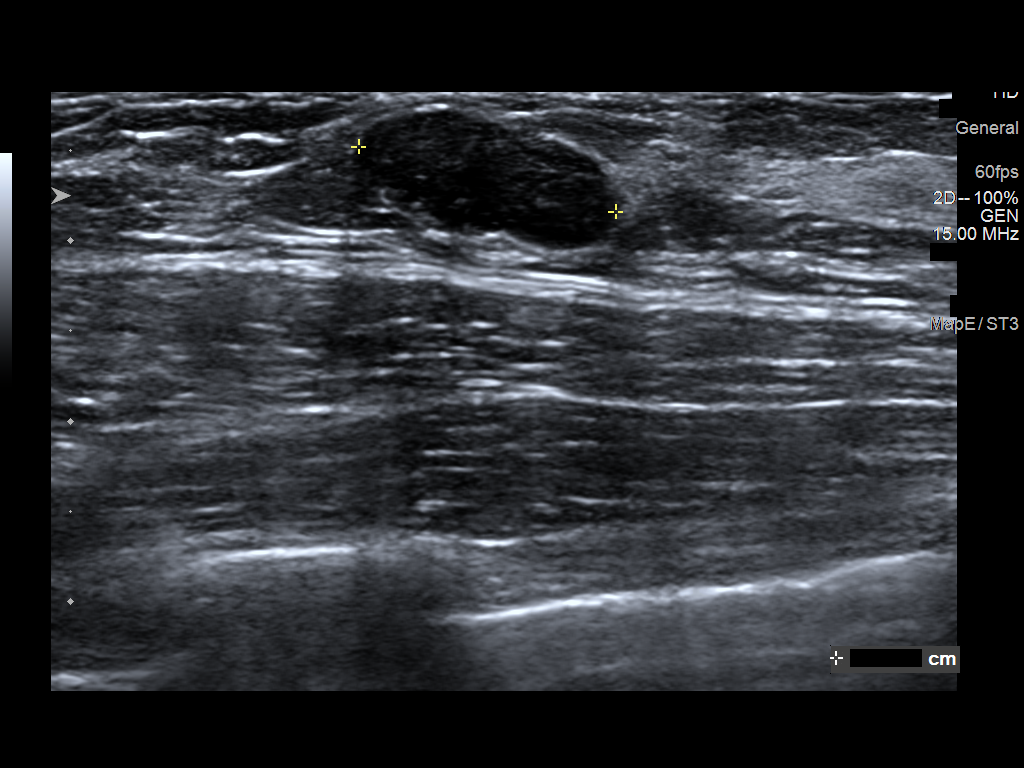
[im 5/5]
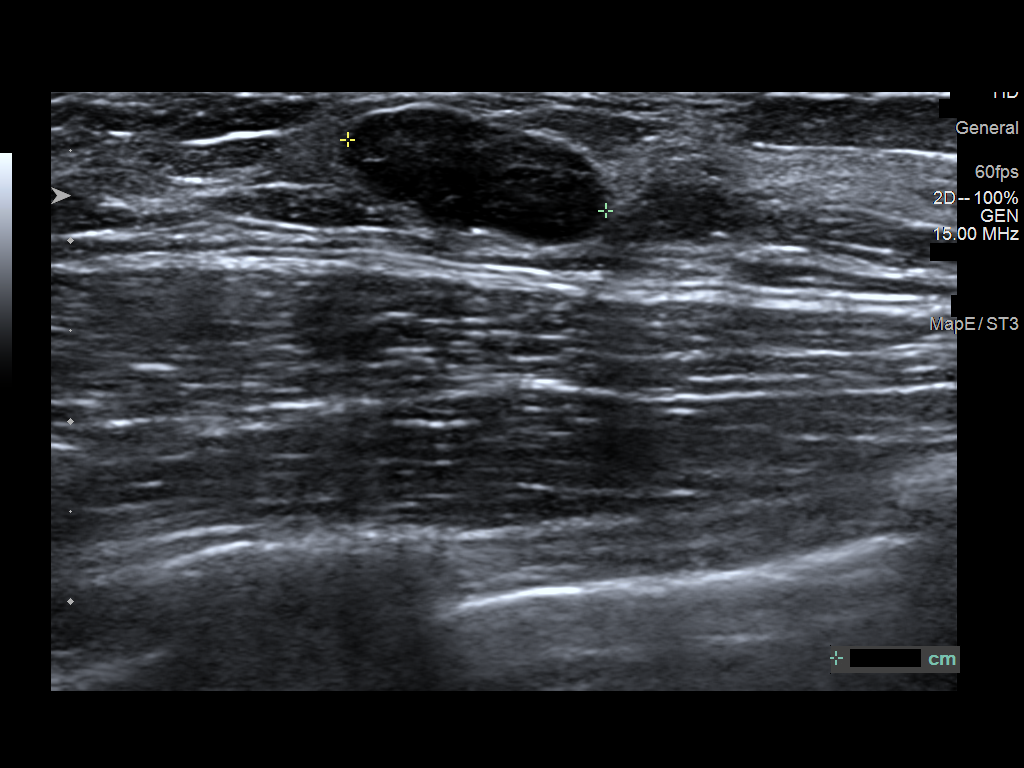

[5 of 5 positions shown; findings below may reference images not displayed]

FINDINGS: Targeted ultrasound is performed, showing stable appearance of oval,
circumscribed hypoechoic masses within the bilateral breasts. This
includes a 1.5 x 1.1 x 0.7 cm mass in the left breast at the 12
o'clock position 9 cm from the nipple (previously 1.7 x 1.0 x
cm) and a 0.9 x 0.8 x 0.5 cm mass in the right breast at the 10
o'clock position 10 cm from the nipple (previously 0.9 x 0.8 x
cm).
IMPRESSION: Stable, probably benign bilateral breast masses. These likely
represent fibroadenomas. Recommendation is for continued short-term
imaging follow-up.

RECOMMENDATION:
Bilateral ultrasound in 6 months.

I have discussed the findings and recommendations with the patient.
Results were also provided in writing at the conclusion of the
visit. If applicable, a reminder letter will be sent to the patient
regarding the next appointment.

BI-RADS CATEGORY  3: Probably benign.

## 2021-08-10 IMAGING — US US BREAST*L* LIMITED INC AXILLA
1 series · 4 of 4 positions shown · non-contrast
Comparison: 06/22/2018, 12/09/2017.

CLINICAL DATA: One year interval follow-up of likely benign
fibroadenomas in both breasts.

EXAM:
LIMITED ULTRASOUND OF THE BILATERAL BREASTS

[Series 1: us breast*left* limited inc axilla · 0.06mm/px · 4 of 4 slices shown]
[im 1/4]
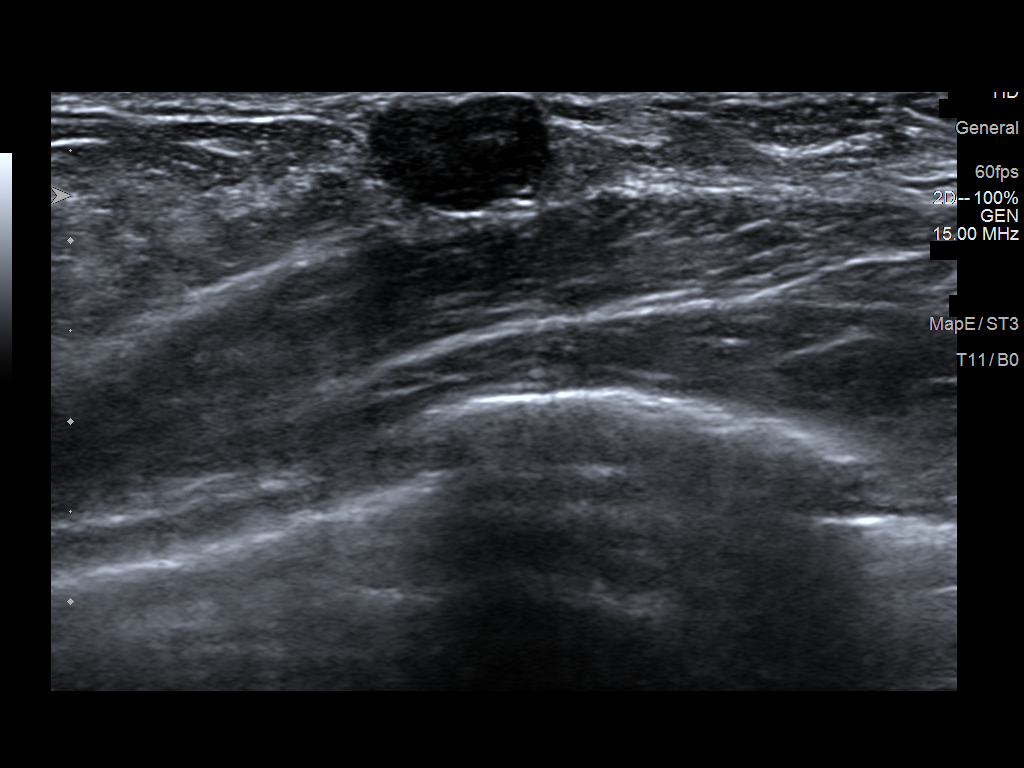
[im 2/4]
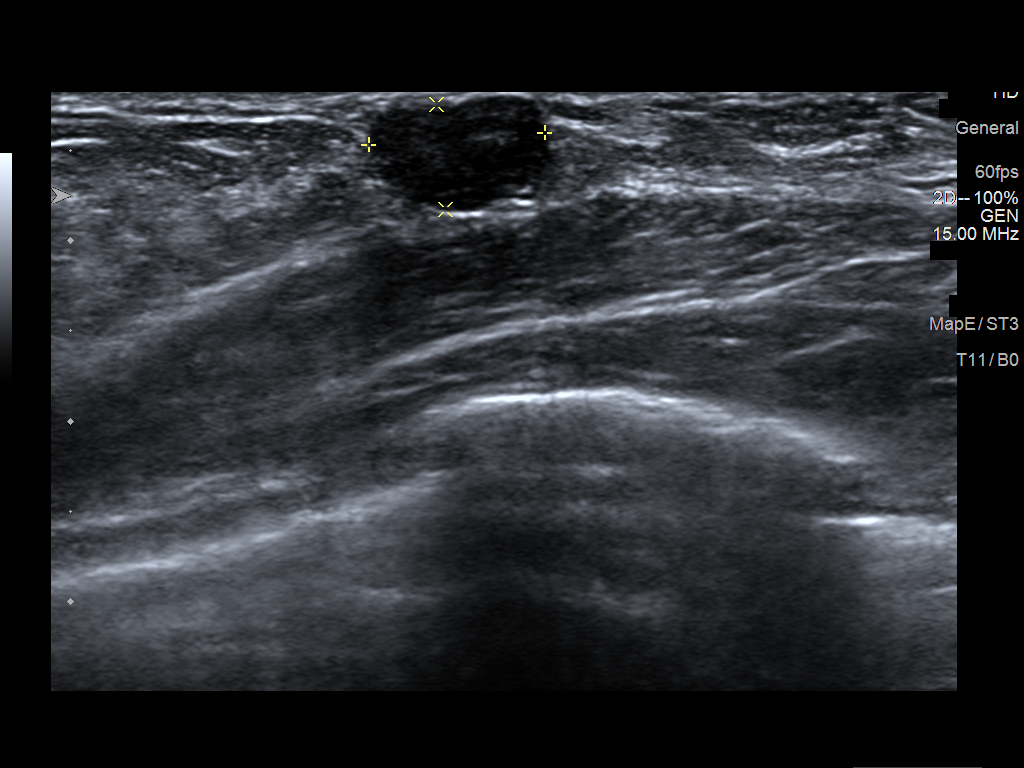
[im 3/4]
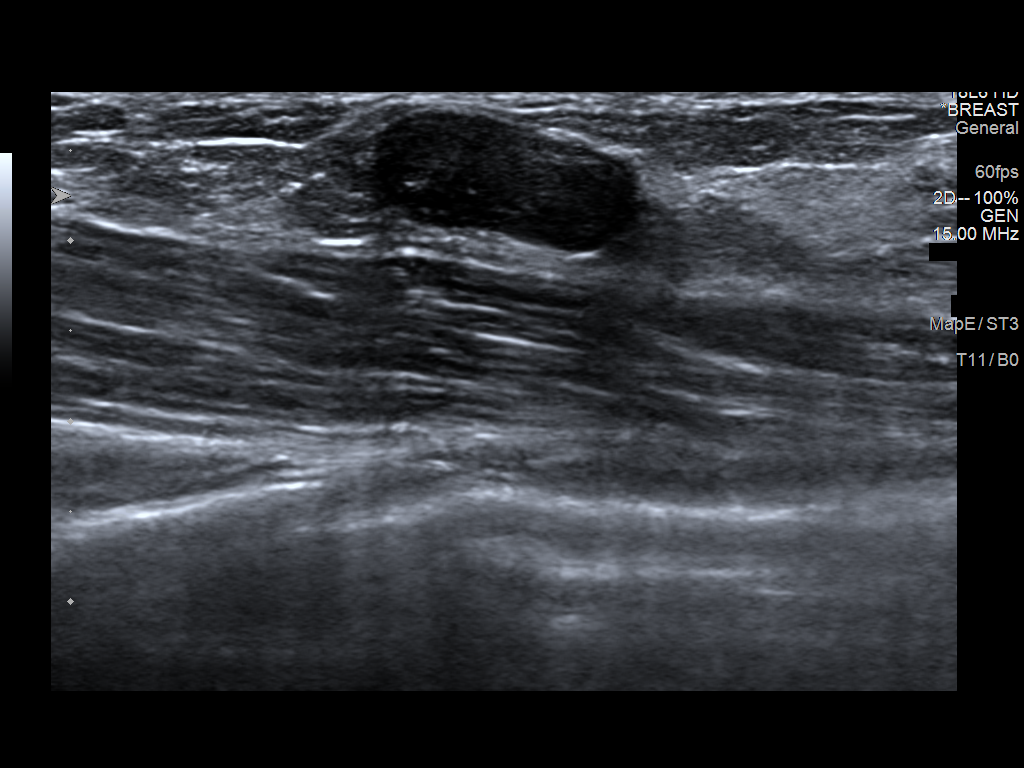
[im 4/4]
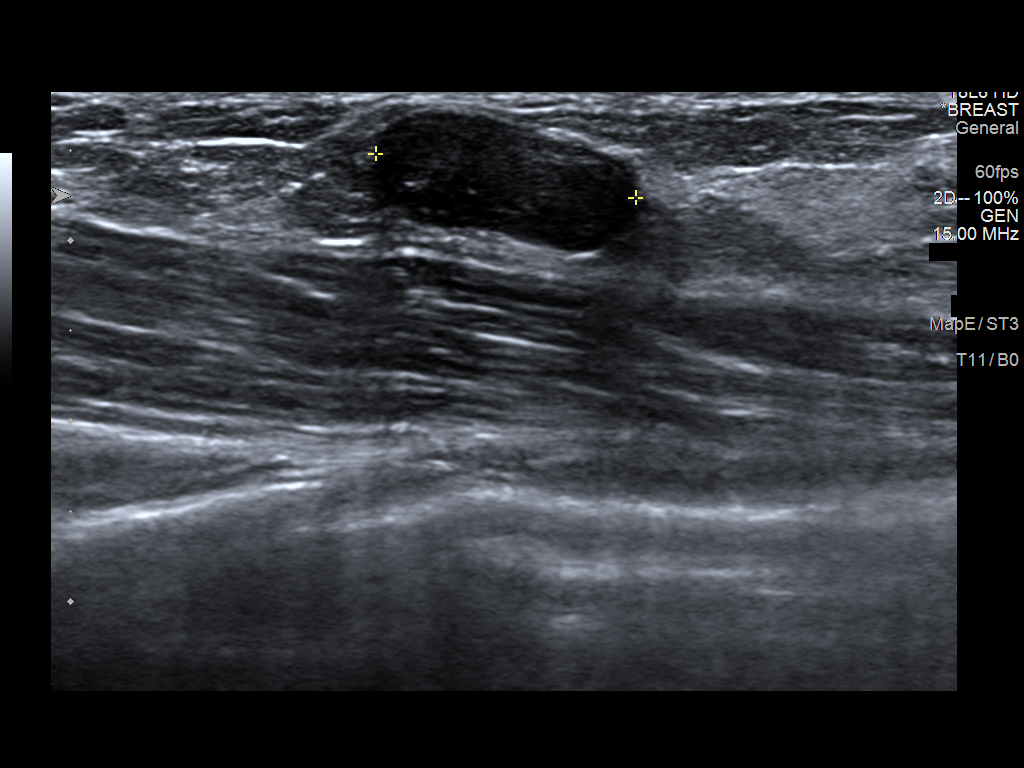

[4 of 4 positions shown; findings below may reference images not displayed]

FINDINGS: Targeted RIGHT breast ultrasound is performed, showing the
previously identified circumscribed oval parallel hypoechoic mass at
the 10 o'clock position approximately 10 cm from the nipple
measuring approximately 0.4 x 0.9 x 0.8 cm (previously 0.5 x 0.9 x
0.8 cm on 06/22/2018 and 0.5 x 0.9 x 0.8 cm on 12/09/2017).

Targeted LEFT breast ultrasound is performed, showing the previously
identified circumscribed oval parallel hypoechoic mass at the 12
o'clock position approximately 9 cm from the nipple measuring
approximately 0.6 x 1.0 x 1.5 cm (previously 0.7 x 1.1 x 1.5 cm on
06/22/2018 and 0.9 x 1.0 x 1.7 cm on 12/09/2017).
IMPRESSION: Stable likely benign BILATERAL breast masses, likely fibroadenomas.

RECOMMENDATION:
BILATERAL breast ultrasound in 1 year in order to confirm 2 years of
stability of the likely benign masses.

I have discussed the findings and recommendations with the patient.
If applicable, a reminder letter will be sent to the patient
regarding the next appointment.

BI-RADS CATEGORY  3: Probably benign.

## 2021-08-10 IMAGING — US US BREAST*R* LIMITED INC AXILLA
1 series · 4 of 4 positions shown · non-contrast
Comparison: 06/22/2018, 12/09/2017.

CLINICAL DATA: One year interval follow-up of likely benign
fibroadenomas in both breasts.

EXAM:
LIMITED ULTRASOUND OF THE BILATERAL BREASTS

[Series 1: us breast*right* limited inc axilla · 0.05mm/px · 4 of 4 slices shown]
[im 1/4]
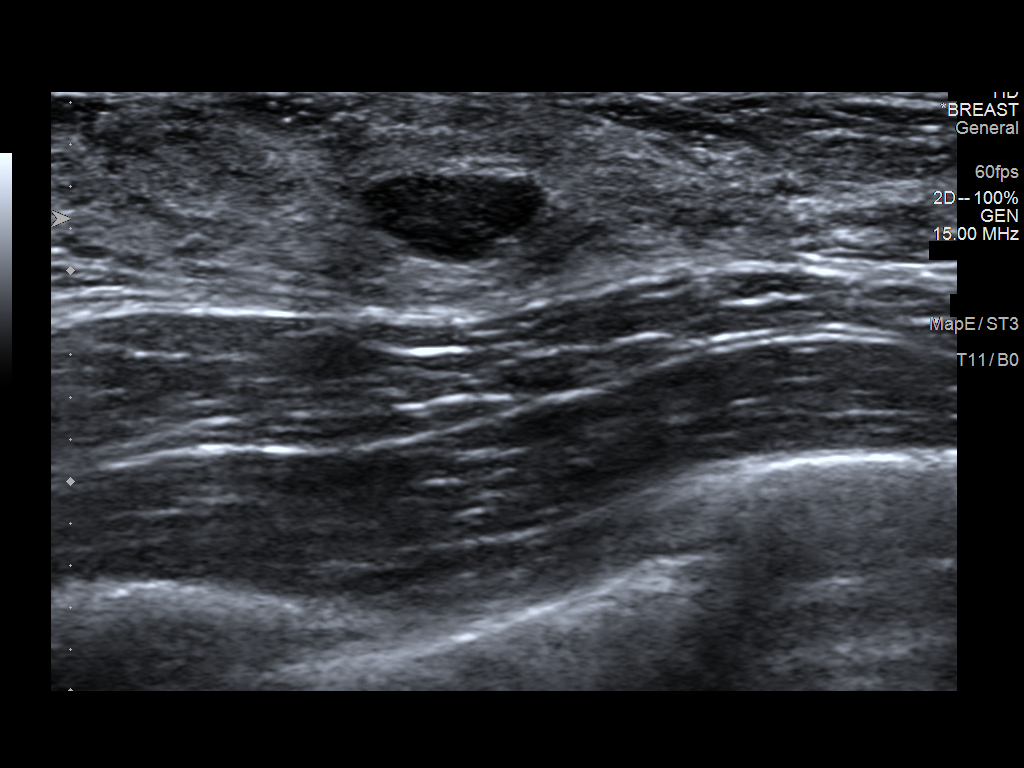
[im 2/4]
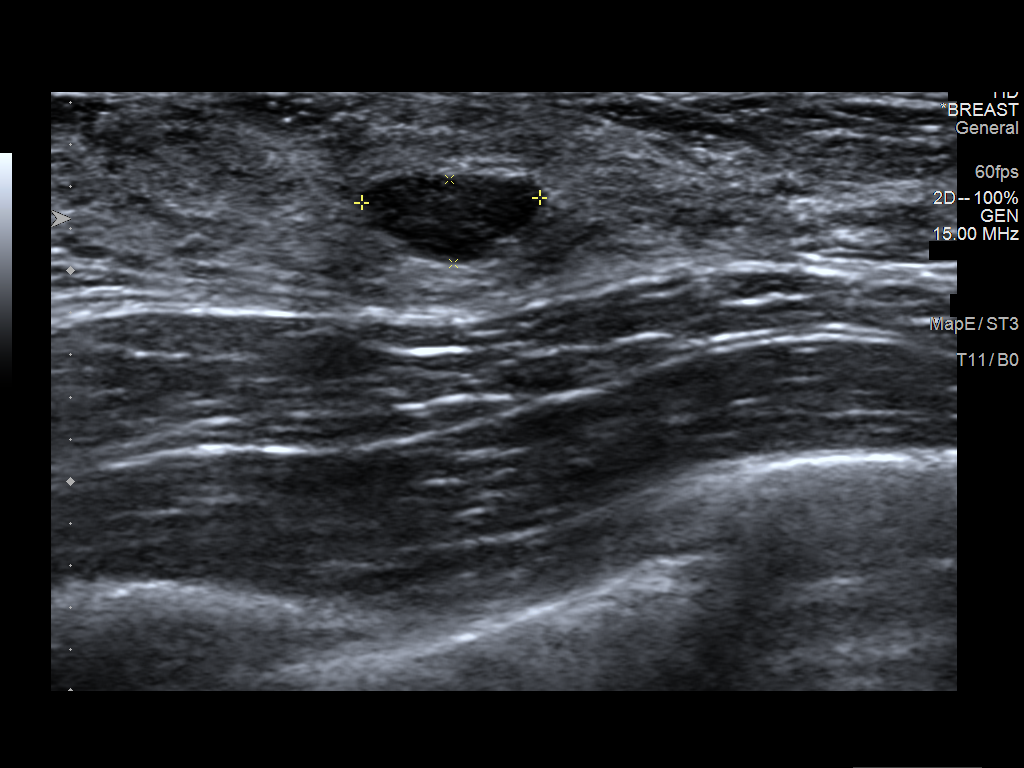
[im 3/4]
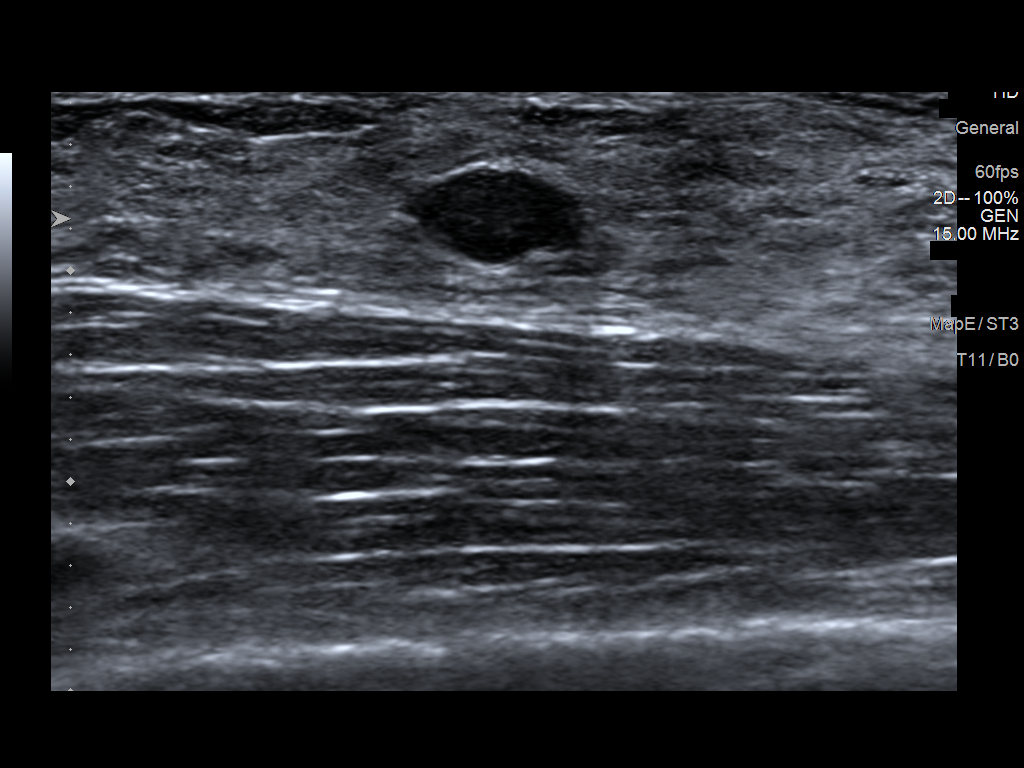
[im 4/4]
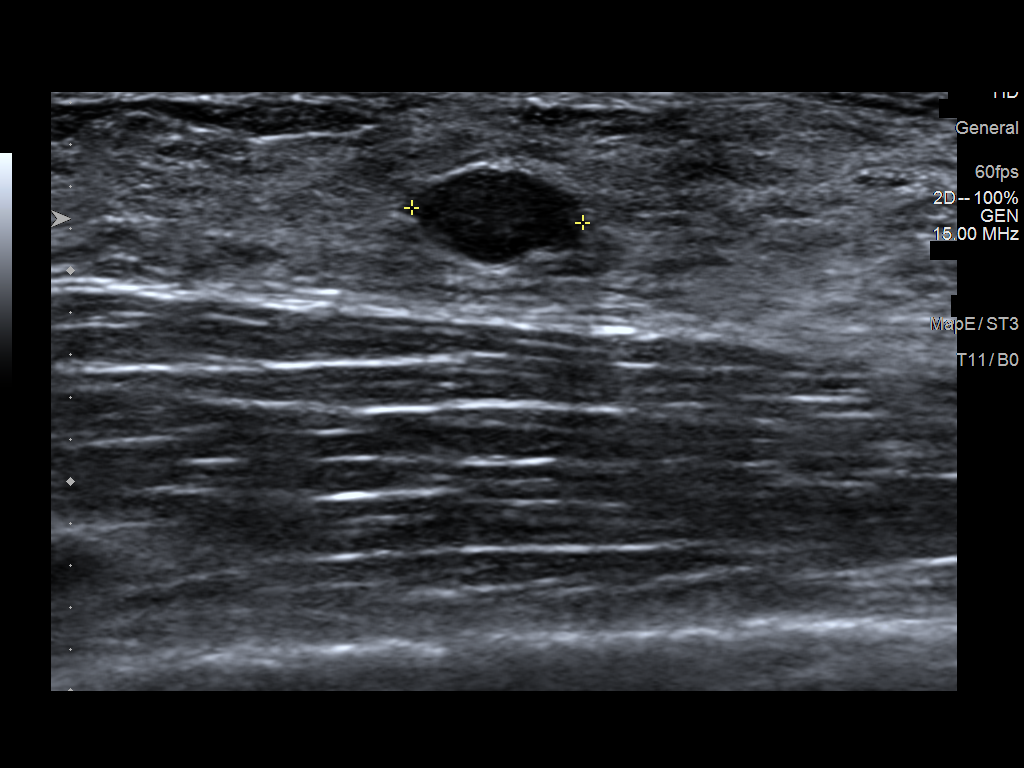

[4 of 4 positions shown; findings below may reference images not displayed]

FINDINGS: Targeted RIGHT breast ultrasound is performed, showing the
previously identified circumscribed oval parallel hypoechoic mass at
the 10 o'clock position approximately 10 cm from the nipple
measuring approximately 0.4 x 0.9 x 0.8 cm (previously 0.5 x 0.9 x
0.8 cm on 06/22/2018 and 0.5 x 0.9 x 0.8 cm on 12/09/2017).

Targeted LEFT breast ultrasound is performed, showing the previously
identified circumscribed oval parallel hypoechoic mass at the 12
o'clock position approximately 9 cm from the nipple measuring
approximately 0.6 x 1.0 x 1.5 cm (previously 0.7 x 1.1 x 1.5 cm on
06/22/2018 and 0.9 x 1.0 x 1.7 cm on 12/09/2017).
IMPRESSION: Stable likely benign BILATERAL breast masses, likely fibroadenomas.

RECOMMENDATION:
BILATERAL breast ultrasound in 1 year in order to confirm 2 years of
stability of the likely benign masses.

I have discussed the findings and recommendations with the patient.
If applicable, a reminder letter will be sent to the patient
regarding the next appointment.

BI-RADS CATEGORY  3: Probably benign.
